# Patient Record
Sex: Female | Born: 1995
Health system: Southern US, Community
[De-identification: ages and names within clinical notes are randomized; demographics above are authoritative.]

## PROBLEM LIST (undated history)

## (undated) DIAGNOSIS — J45990 Exercise induced bronchospasm: Secondary | ICD-10-CM

## (undated) DIAGNOSIS — H7290 Unspecified perforation of tympanic membrane, unspecified ear: Secondary | ICD-10-CM

## (undated) DIAGNOSIS — Z87820 Personal history of traumatic brain injury: Secondary | ICD-10-CM

## (undated) DIAGNOSIS — J302 Other seasonal allergic rhinitis: Secondary | ICD-10-CM

## (undated) DIAGNOSIS — Z8489 Family history of other specified conditions: Secondary | ICD-10-CM

## (undated) HISTORY — PX: WISDOM TOOTH EXTRACTION: SHX21

---
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
## 2016-06-09 ENCOUNTER — Ambulatory Visit (INDEPENDENT_AMBULATORY_CARE_PROVIDER_SITE_OTHER): Payer: BLUE CROSS/BLUE SHIELD | Admitting: Otolaryngology

## 2016-06-09 DIAGNOSIS — H6983 Other specified disorders of Eustachian tube, bilateral: Secondary | ICD-10-CM

## 2016-06-09 DIAGNOSIS — H7201 Central perforation of tympanic membrane, right ear: Secondary | ICD-10-CM

## 2016-06-09 DIAGNOSIS — J343 Hypertrophy of nasal turbinates: Secondary | ICD-10-CM

## 2016-06-09 DIAGNOSIS — J310 Chronic rhinitis: Secondary | ICD-10-CM

## 2016-06-10 ENCOUNTER — Other Ambulatory Visit (INDEPENDENT_AMBULATORY_CARE_PROVIDER_SITE_OTHER): Payer: Self-pay | Admitting: Otolaryngology

## 2016-06-10 DIAGNOSIS — J329 Chronic sinusitis, unspecified: Secondary | ICD-10-CM

## 2016-06-16 ENCOUNTER — Ambulatory Visit
Admission: RE | Admit: 2016-06-16 | Discharge: 2016-06-16 | Disposition: A | Discharge status: Home / Self Care | Payer: BLUE CROSS/BLUE SHIELD | Source: Ambulatory Visit | Attending: Otolaryngology | Admitting: Otolaryngology

## 2016-06-16 DIAGNOSIS — J329 Chronic sinusitis, unspecified: Secondary | ICD-10-CM

## 2016-06-29 DIAGNOSIS — J343 Hypertrophy of nasal turbinates: Secondary | ICD-10-CM | POA: Diagnosis not present

## 2016-06-29 DIAGNOSIS — J310 Chronic rhinitis: Secondary | ICD-10-CM | POA: Diagnosis not present

## 2016-06-29 DIAGNOSIS — J342 Deviated nasal septum: Secondary | ICD-10-CM | POA: Diagnosis not present

## 2016-07-05 DIAGNOSIS — J3089 Other allergic rhinitis: Secondary | ICD-10-CM | POA: Diagnosis not present

## 2016-07-05 DIAGNOSIS — J45990 Exercise induced bronchospasm: Secondary | ICD-10-CM | POA: Diagnosis not present

## 2016-07-05 DIAGNOSIS — J3081 Allergic rhinitis due to animal (cat) (dog) hair and dander: Secondary | ICD-10-CM | POA: Diagnosis not present

## 2017-01-23 DIAGNOSIS — L700 Acne vulgaris: Secondary | ICD-10-CM | POA: Diagnosis not present

## 2017-01-26 DIAGNOSIS — R197 Diarrhea, unspecified: Secondary | ICD-10-CM | POA: Diagnosis not present

## 2017-01-26 DIAGNOSIS — R5383 Other fatigue: Secondary | ICD-10-CM | POA: Diagnosis not present

## 2017-01-26 DIAGNOSIS — R51 Headache: Secondary | ICD-10-CM | POA: Diagnosis not present

## 2017-02-06 DIAGNOSIS — D72829 Elevated white blood cell count, unspecified: Secondary | ICD-10-CM | POA: Diagnosis not present

## 2017-02-10 DIAGNOSIS — R197 Diarrhea, unspecified: Secondary | ICD-10-CM | POA: Diagnosis not present

## 2017-02-10 DIAGNOSIS — G43109 Migraine with aura, not intractable, without status migrainosus: Secondary | ICD-10-CM | POA: Diagnosis not present

## 2017-02-15 DIAGNOSIS — M25561 Pain in right knee: Secondary | ICD-10-CM | POA: Diagnosis not present

## 2017-02-15 DIAGNOSIS — M2391 Unspecified internal derangement of right knee: Secondary | ICD-10-CM | POA: Diagnosis not present

## 2017-02-24 DIAGNOSIS — M1711 Unilateral primary osteoarthritis, right knee: Secondary | ICD-10-CM | POA: Diagnosis not present

## 2017-02-24 DIAGNOSIS — M25461 Effusion, right knee: Secondary | ICD-10-CM | POA: Diagnosis not present

## 2017-03-01 DIAGNOSIS — S83411A Sprain of medial collateral ligament of right knee, initial encounter: Secondary | ICD-10-CM | POA: Diagnosis not present

## 2017-03-17 DIAGNOSIS — M25561 Pain in right knee: Secondary | ICD-10-CM | POA: Diagnosis not present

## 2017-03-17 DIAGNOSIS — M25661 Stiffness of right knee, not elsewhere classified: Secondary | ICD-10-CM | POA: Diagnosis not present

## 2017-03-22 DIAGNOSIS — H1031 Unspecified acute conjunctivitis, right eye: Secondary | ICD-10-CM | POA: Diagnosis not present

## 2017-04-10 DIAGNOSIS — J029 Acute pharyngitis, unspecified: Secondary | ICD-10-CM | POA: Diagnosis not present

## 2017-04-10 DIAGNOSIS — J0100 Acute maxillary sinusitis, unspecified: Secondary | ICD-10-CM | POA: Diagnosis not present

## 2017-04-10 DIAGNOSIS — R509 Fever, unspecified: Secondary | ICD-10-CM | POA: Diagnosis not present

## 2017-06-15 DIAGNOSIS — L709 Acne, unspecified: Secondary | ICD-10-CM | POA: Diagnosis not present

## 2017-06-15 DIAGNOSIS — Z01419 Encounter for gynecological examination (general) (routine) without abnormal findings: Secondary | ICD-10-CM | POA: Diagnosis not present

## 2017-06-15 DIAGNOSIS — N946 Dysmenorrhea, unspecified: Secondary | ICD-10-CM | POA: Diagnosis not present

## 2017-06-16 DIAGNOSIS — J45990 Exercise induced bronchospasm: Secondary | ICD-10-CM | POA: Diagnosis not present

## 2017-06-16 DIAGNOSIS — J3081 Allergic rhinitis due to animal (cat) (dog) hair and dander: Secondary | ICD-10-CM | POA: Diagnosis not present

## 2017-06-16 DIAGNOSIS — J3089 Other allergic rhinitis: Secondary | ICD-10-CM | POA: Diagnosis not present

## 2017-06-16 DIAGNOSIS — L989 Disorder of the skin and subcutaneous tissue, unspecified: Secondary | ICD-10-CM | POA: Diagnosis not present

## 2017-07-09 DIAGNOSIS — R197 Diarrhea, unspecified: Secondary | ICD-10-CM | POA: Diagnosis not present

## 2017-07-09 DIAGNOSIS — R112 Nausea with vomiting, unspecified: Secondary | ICD-10-CM | POA: Diagnosis not present

## 2017-07-09 DIAGNOSIS — K529 Noninfective gastroenteritis and colitis, unspecified: Secondary | ICD-10-CM | POA: Diagnosis not present

## 2017-07-20 IMAGING — CT CT MAXILLOFACIAL W/O CM
3 series · 18 of 37 positions shown, 20 images · non-contrast
Comparison: None.

CLINICAL DATA: Chronic sinus symptoms.  Postnasal drainage.

EXAM:
CT MAXILLOFACIAL WITHOUT CONTRAST
TECHNIQUE: Multidetector CT imaging of the maxillofacial structures was
performed. Multiplanar CT image reconstructions were also generated.
A small metallic BB was placed on the right temple in order to
reliably differentiate right from left.

[Series 3: axial soft 1.25 · axial · 0.49mm/px · z∈[-33,+100]mm · 7 of 158 slices shown]
[im 17/158  brain]
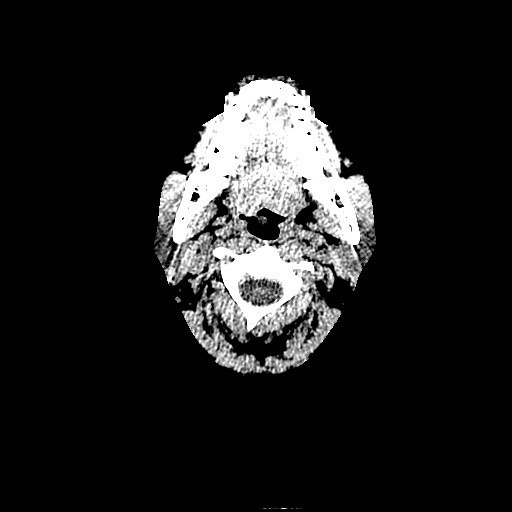
[im 34/158  brain]
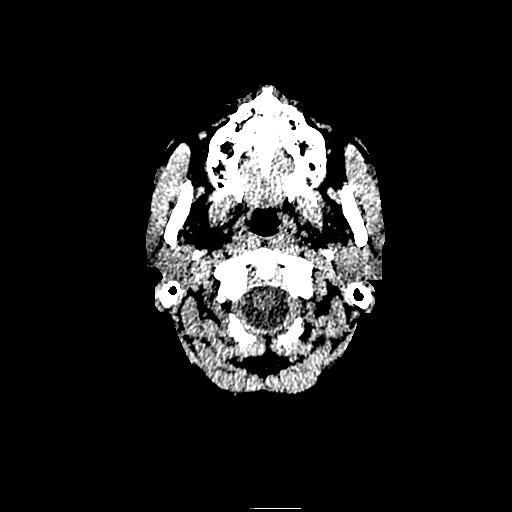
[im 50/158  brain]
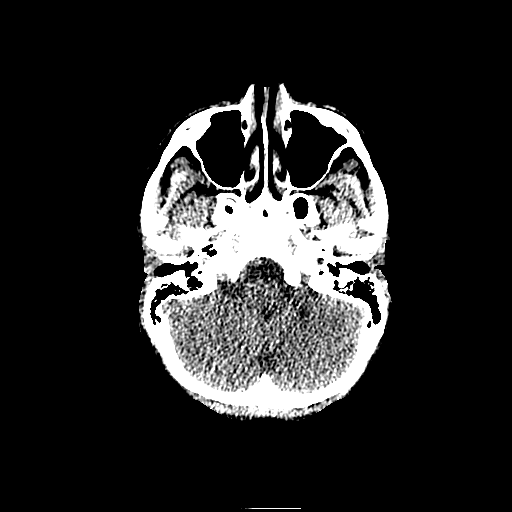
[im 67/158  brain]
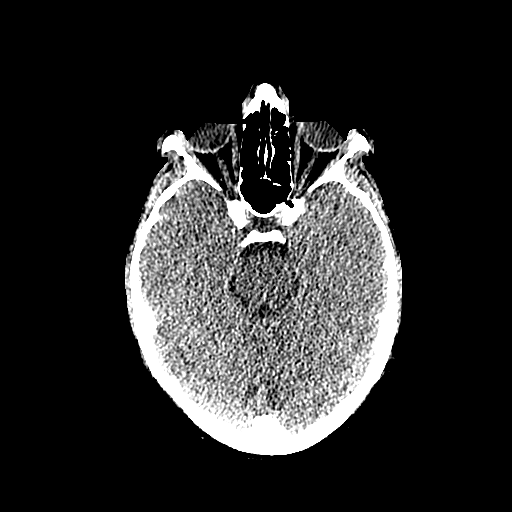
[im 91/158  brain]
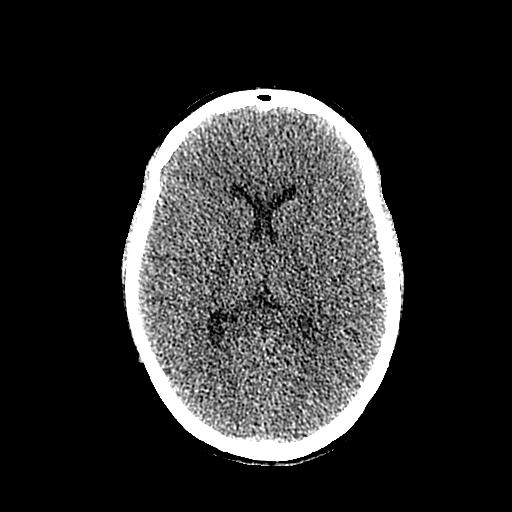
[im 108/158  brain]
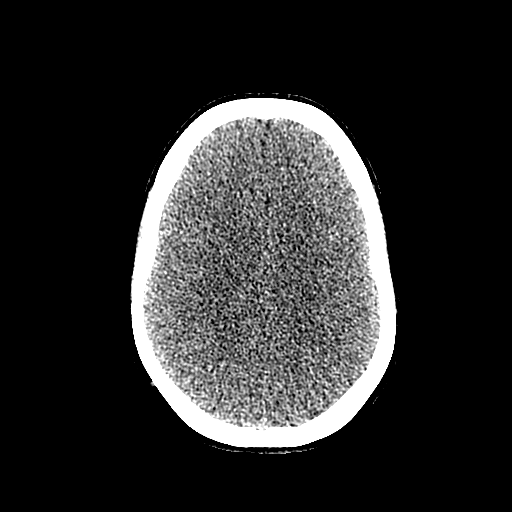
[im 124/158  brain]
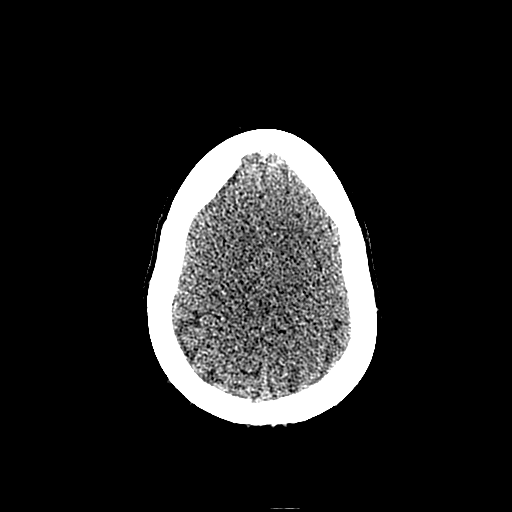

[Series 601: coronal facial · coronal · 0.49mm/px · 3 of 111 slices shown]
[im 37/111  bone]
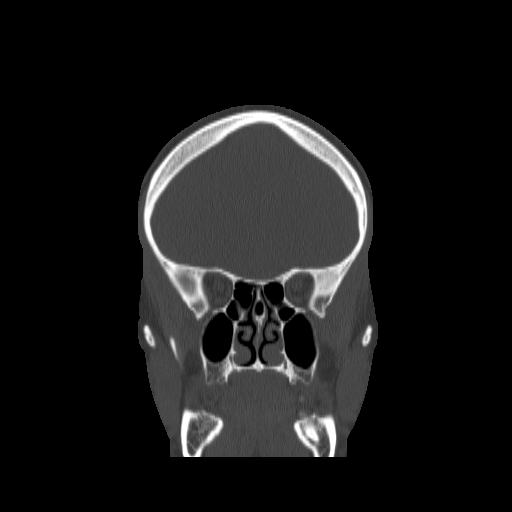
[im 56/111  bone]
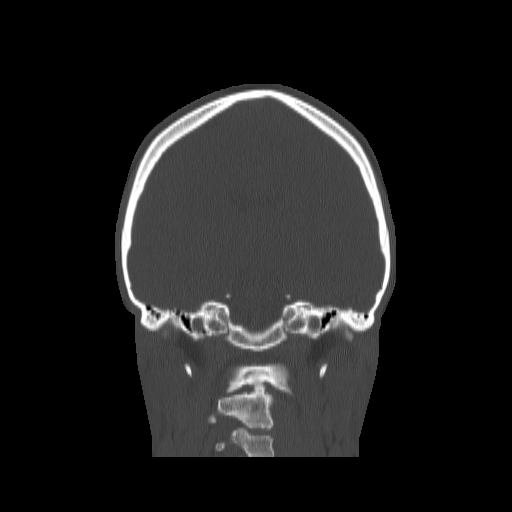
[im 74/111  bone]
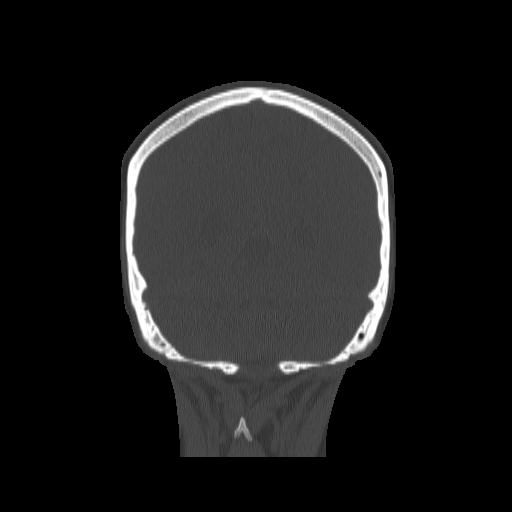

[Series 602: sagittal facial · sagittal · 0.49mm/px · 8 of 78 slices shown, 10 images]
[im 9/78  brain]
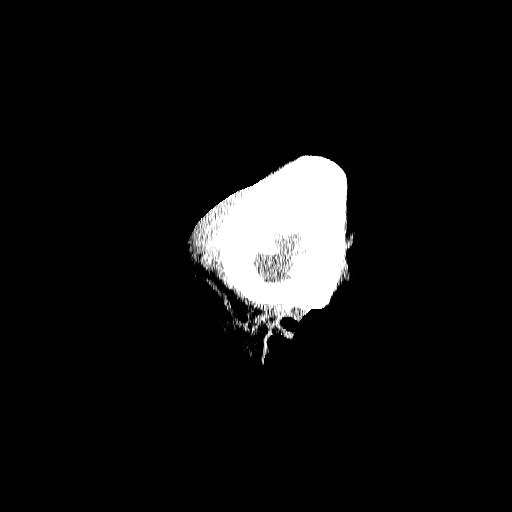
[im 9/78  bone]
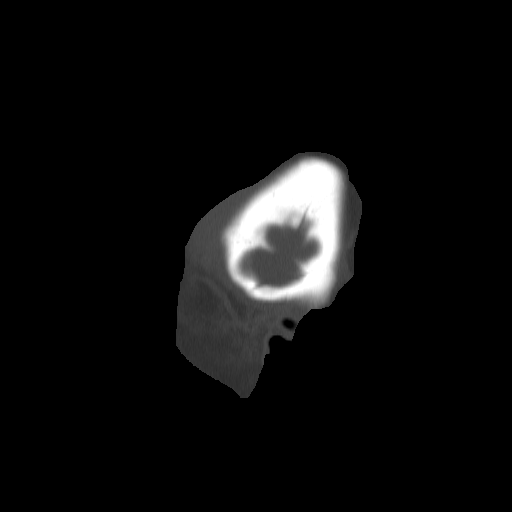
[im 18/78  bone]
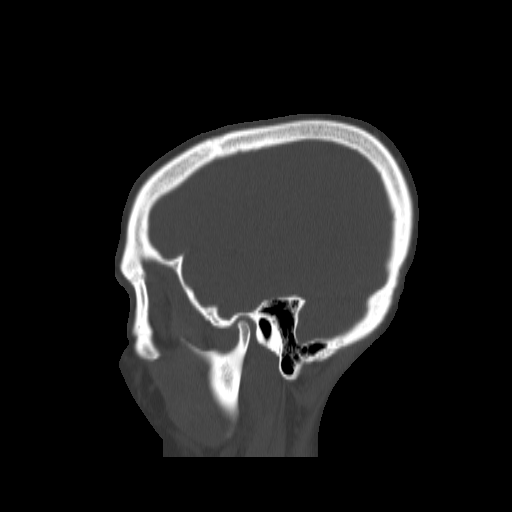
[im 26/78  bone]
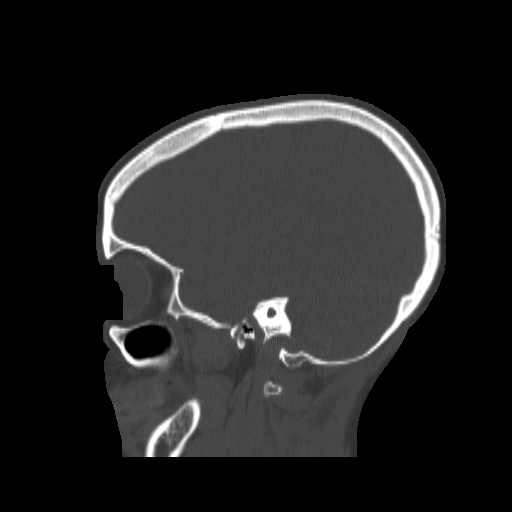
[im 35/78  bone]
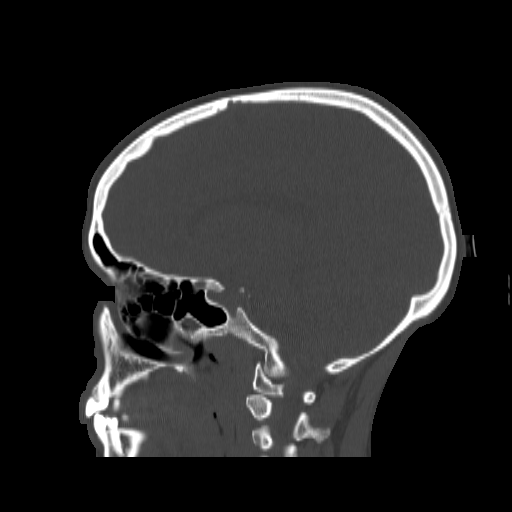
[im 43/78  brain]
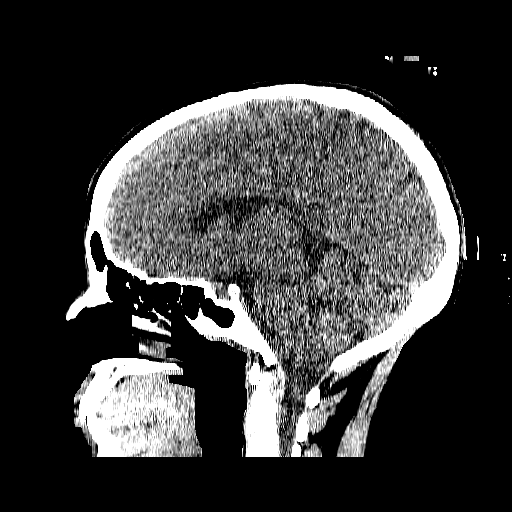
[im 43/78  bone]
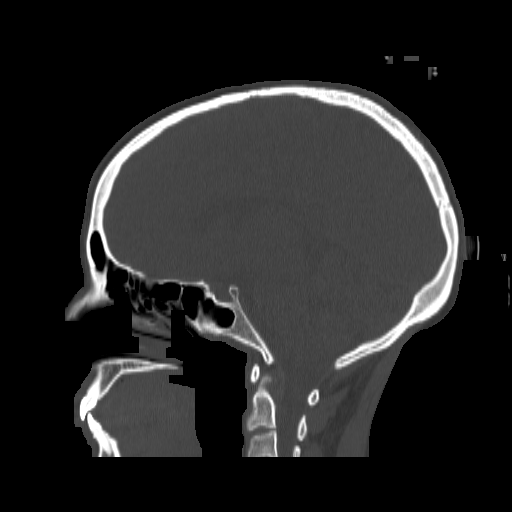
[im 52/78  bone]
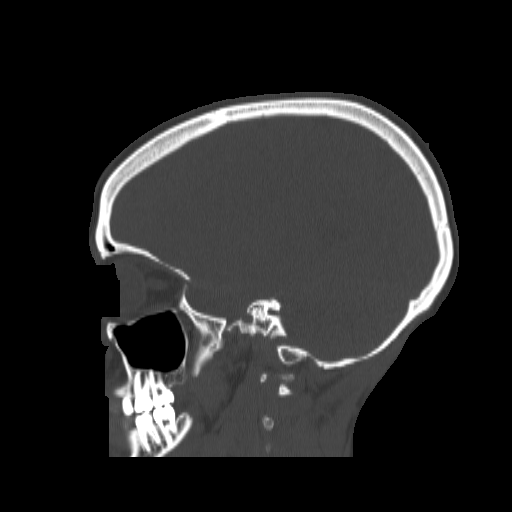
[im 60/78  bone]
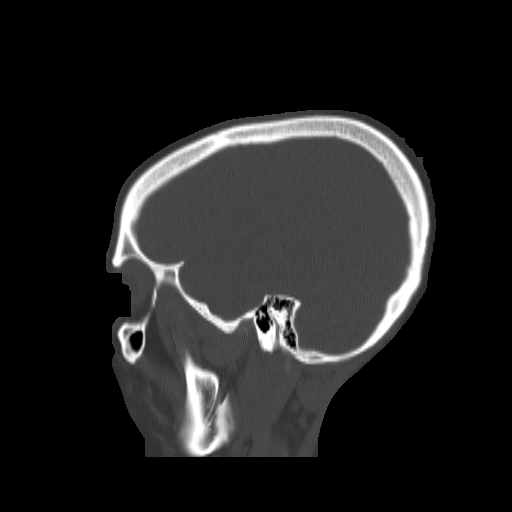
[im 69/78  bone]
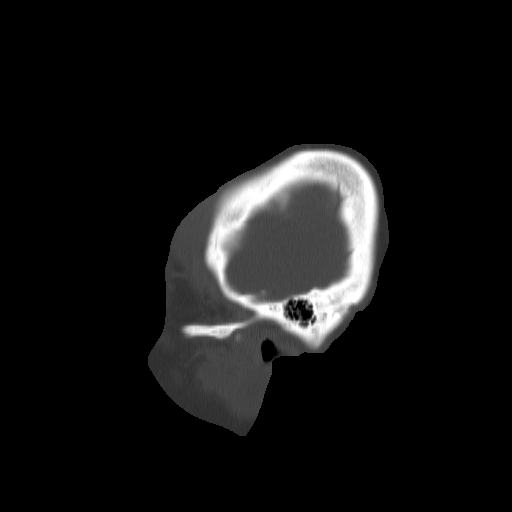

[18 of 37 positions shown; findings below may reference images not displayed]

FINDINGS: Osseous: No fracture or mandibular dislocation. No destructive
process.

Orbits: Negative. No traumatic or inflammatory finding.

Sinuses: Clear. Nasal septum deviates mildly LEFT-to-RIGHT. No
turbinate hypertrophy or concha bullosa.

Soft tissues: Negative.

Limited intracranial: None.
IMPRESSION: Negative exam.

## 2017-10-13 DIAGNOSIS — H1033 Unspecified acute conjunctivitis, bilateral: Secondary | ICD-10-CM | POA: Diagnosis not present

## 2017-10-26 DIAGNOSIS — Z7189 Other specified counseling: Secondary | ICD-10-CM | POA: Diagnosis not present

## 2017-10-26 DIAGNOSIS — Z23 Encounter for immunization: Secondary | ICD-10-CM | POA: Diagnosis not present

## 2017-12-27 DIAGNOSIS — R197 Diarrhea, unspecified: Secondary | ICD-10-CM | POA: Diagnosis not present

## 2018-01-17 DIAGNOSIS — J0100 Acute maxillary sinusitis, unspecified: Secondary | ICD-10-CM | POA: Diagnosis not present

## 2018-01-17 DIAGNOSIS — R05 Cough: Secondary | ICD-10-CM | POA: Diagnosis not present

## 2018-02-12 DIAGNOSIS — N809 Endometriosis, unspecified: Secondary | ICD-10-CM | POA: Diagnosis not present

## 2018-02-12 DIAGNOSIS — R102 Pelvic and perineal pain: Secondary | ICD-10-CM | POA: Diagnosis not present

## 2018-02-14 DIAGNOSIS — N809 Endometriosis, unspecified: Secondary | ICD-10-CM | POA: Diagnosis not present

## 2018-02-14 DIAGNOSIS — R102 Pelvic and perineal pain: Secondary | ICD-10-CM | POA: Diagnosis not present

## 2018-06-11 DIAGNOSIS — J45990 Exercise induced bronchospasm: Secondary | ICD-10-CM | POA: Diagnosis not present

## 2018-06-11 DIAGNOSIS — J3089 Other allergic rhinitis: Secondary | ICD-10-CM | POA: Diagnosis not present

## 2018-06-11 DIAGNOSIS — J3081 Allergic rhinitis due to animal (cat) (dog) hair and dander: Secondary | ICD-10-CM | POA: Diagnosis not present

## 2018-06-21 DIAGNOSIS — R102 Pelvic and perineal pain: Secondary | ICD-10-CM | POA: Diagnosis not present

## 2018-06-21 DIAGNOSIS — Z6824 Body mass index (BMI) 24.0-24.9, adult: Secondary | ICD-10-CM | POA: Diagnosis not present

## 2018-06-21 DIAGNOSIS — N939 Abnormal uterine and vaginal bleeding, unspecified: Secondary | ICD-10-CM | POA: Diagnosis not present

## 2018-06-21 DIAGNOSIS — Z124 Encounter for screening for malignant neoplasm of cervix: Secondary | ICD-10-CM | POA: Diagnosis not present

## 2018-06-21 DIAGNOSIS — Z01419 Encounter for gynecological examination (general) (routine) without abnormal findings: Secondary | ICD-10-CM | POA: Diagnosis not present

## 2019-05-08 ENCOUNTER — Other Ambulatory Visit: Payer: Self-pay

## 2019-05-08 DIAGNOSIS — Z20822 Contact with and (suspected) exposure to covid-19: Secondary | ICD-10-CM

## 2019-05-09 LAB — NOVEL CORONAVIRUS, NAA: SARS-CoV-2, NAA: NOT DETECTED

## 2019-05-14 ENCOUNTER — Ambulatory Visit (INDEPENDENT_AMBULATORY_CARE_PROVIDER_SITE_OTHER): Payer: Commercial Managed Care - PPO | Admitting: Sports Medicine

## 2019-05-14 ENCOUNTER — Other Ambulatory Visit: Payer: Self-pay

## 2019-05-14 VITALS — BP 102/68 | Ht 63.0 in | Wt 133.0 lb

## 2019-05-14 DIAGNOSIS — M7740 Metatarsalgia, unspecified foot: Secondary | ICD-10-CM

## 2019-05-15 ENCOUNTER — Encounter: Payer: Self-pay | Admitting: Sports Medicine

## 2019-05-15 NOTE — Progress Notes (Signed)
   Subjective:    Patient ID: Melissa Richmond, female    DOB: 1996-05-29, 23 y.o.   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 French Ana for possible custom orthotics.  She recently trained for and completed a marathon.  During her training she began to experience pain in the right foot that she localizes to the plantar aspect of the foot near the second metatarsal head.  Pain began about 6 weeks prior to her race.  She was able to run the race and complete it.  She then saw Dr. French Ana on November 16 and had x-rays of her foot done which are available for my review.  He diagnosed her with possible metatarsalgia and recommended that she try some orthotics.  She denies pain on the dorsum of the foot.  She has not noticed any swelling.  She denies pain elsewhere in the foot.  Her pain occurs at a very specific point, at about 3 to 4 miles into her run.  She is able to walk with minimal discomfort.  She does get some numbness and tingling diffusely through her toes.  No prior foot surgeries.  She denies pain back at the ankle.  Past medical history reviewed Medications reviewed Allergies reviewed   Review of Systems    As above Objective:   Physical Exam  Well-developed, fit appearing.  No acute distress.  Awake alert and oriented x3.  Vital signs reviewed  Examination of her feet in the standing position shows fairly well preserved longitudinal and transverse arch.  She does have callus buildup along the plantar aspect of the foot at the metatarsal heads.  She is mildly tender to palpation at the second metatarsal head. Negative Mulder's sign.  No tenderness to palpation across the dorsum of the foot.  No soft tissue swelling.  Good pulses.  Examination of her running gait shows her to be a heel striker landing and pronation.  No limp.  X-rays of her right foot including AP, lateral, and oblique views are  unremarkable.  These are outside images from [place]:   Right foot pain likely secondary to metatarsalgia Pronation  Custom orthotics were created for the patient today.  We put a metatarsal pad on the right orthotic.  She found these to be comfortable prior to leaving the office.  I did advise her to take a couple of weeks off from running and instead cross train on a bike.  She may then return to running at 50% of her regular volume with instructions not to run 2 days in a row at first.  She may then start to increase her volume by 10 %/week.  If she continues to experience foot pain despite today's custom orthotics and relative rest then she will return to Dr. French Ana for a possible MRI.  Follow-up with me as needed.  Patient was fitted for a : standard, cushioned, semi-rigid orthotic. The orthotic was heated and afterward the patient stood on the orthotic blank positioned on the orthotic stand. The patient was positioned in subtalar neutral position and 10 degrees of ankle dorsiflexion in a weight bearing stance. After completion of molding, a stable base was applied to the orthotic blank. The blank was ground to a stable position for weight bearing. Size: 6 Base: Blue EVA Posting: none Additional orthotic padding: metatarsal pad, right

## 2019-05-16 ENCOUNTER — Ambulatory Visit: Payer: BLUE CROSS/BLUE SHIELD | Admitting: Sports Medicine

## 2019-05-29 ENCOUNTER — Other Ambulatory Visit: Payer: Self-pay

## 2019-05-29 DIAGNOSIS — Z20822 Contact with and (suspected) exposure to covid-19: Secondary | ICD-10-CM

## 2019-05-31 LAB — NOVEL CORONAVIRUS, NAA: SARS-CoV-2, NAA: NOT DETECTED

## 2019-06-04 ENCOUNTER — Other Ambulatory Visit: Payer: Self-pay

## 2019-06-04 DIAGNOSIS — Z20822 Contact with and (suspected) exposure to covid-19: Secondary | ICD-10-CM

## 2019-06-05 LAB — NOVEL CORONAVIRUS, NAA: SARS-CoV-2, NAA: NOT DETECTED

## 2019-06-07 ENCOUNTER — Other Ambulatory Visit: Payer: Self-pay

## 2019-06-07 DIAGNOSIS — Z20822 Contact with and (suspected) exposure to covid-19: Secondary | ICD-10-CM

## 2019-06-08 LAB — NOVEL CORONAVIRUS, NAA: SARS-CoV-2, NAA: NOT DETECTED

## 2019-10-08 ENCOUNTER — Other Ambulatory Visit: Payer: Self-pay | Admitting: Physician Assistant

## 2019-10-08 DIAGNOSIS — R1030 Lower abdominal pain, unspecified: Secondary | ICD-10-CM

## 2019-10-10 ENCOUNTER — Other Ambulatory Visit: Payer: Self-pay | Admitting: Physician Assistant

## 2019-10-10 DIAGNOSIS — R1030 Lower abdominal pain, unspecified: Secondary | ICD-10-CM

## 2019-10-24 ENCOUNTER — Ambulatory Visit
Admission: RE | Admit: 2019-10-24 | Discharge: 2019-10-24 | Disposition: A | Discharge status: Home / Self Care | Payer: Commercial Managed Care - PPO | Source: Ambulatory Visit | Attending: Physician Assistant | Admitting: Physician Assistant

## 2019-10-24 DIAGNOSIS — R1030 Lower abdominal pain, unspecified: Secondary | ICD-10-CM

## 2019-10-24 MED ORDER — IOPAMIDOL (ISOVUE-300) INJECTION 61%
100.0000 mL | Freq: Once | INTRAVENOUS | Status: AC | PRN
  Administered 2019-10-24: 100 mL via INTRAVENOUS

## 2020-11-26 IMAGING — CT CT ABD-PELV W/ CM
1 of 2 series · 14 of 32 positions shown, 19 images · IV contrast (APPLIED)
Comparison: 02/17/2012

CLINICAL DATA: Lower abdominal pain, cramping, and bloating for 4
months.

EXAM:
CT ABDOMEN AND PELVIS WITH CONTRAST
TECHNIQUE: Multidetector CT imaging of the abdomen and pelvis was performed
using the standard protocol following bolus administration of
intravenous contrast.
CONTRAST:  100mL [id number] IOPAMIDOL ([id number]) INJECTION 61%

[Series 2: abd/pelvis w/cm · axial · 0.67mm/px · z∈[-501,-86]mm · 14 of 93 slices shown, 19 images]
[im 5/93  soft-tissue]
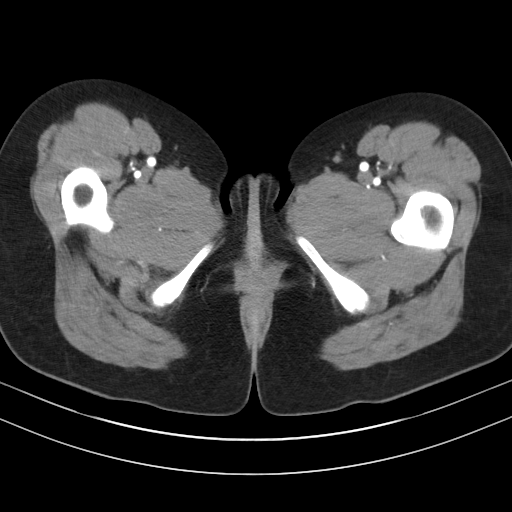
[im 5/93  bone]
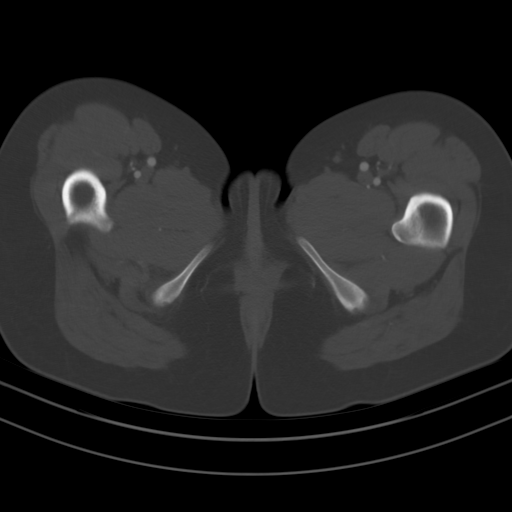
[im 14/93  soft-tissue]
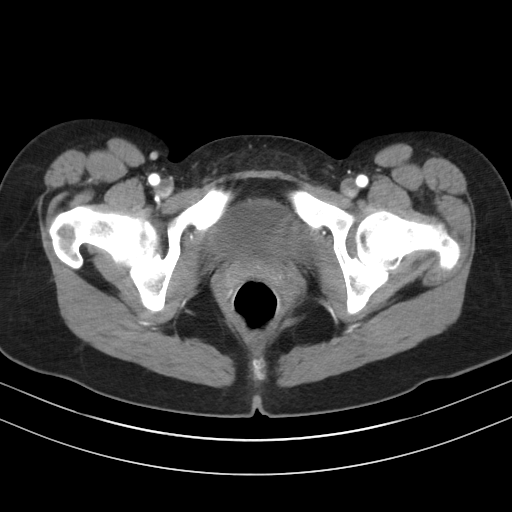
[im 19/93  soft-tissue]
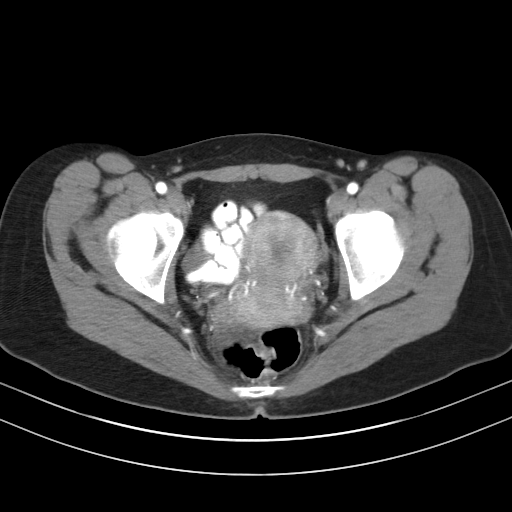
[im 28/93  soft-tissue]
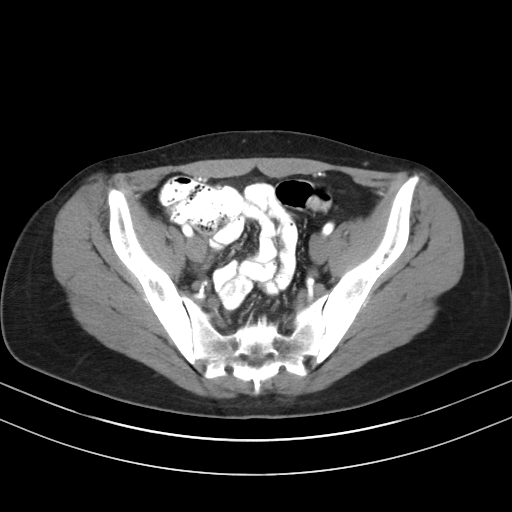
[im 33/93  soft-tissue]
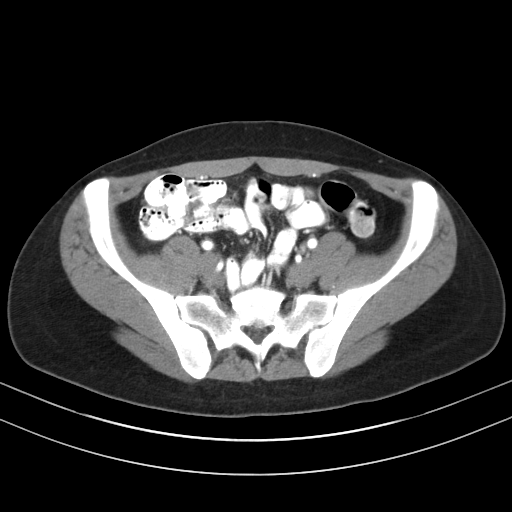
[im 42/93  soft-tissue]
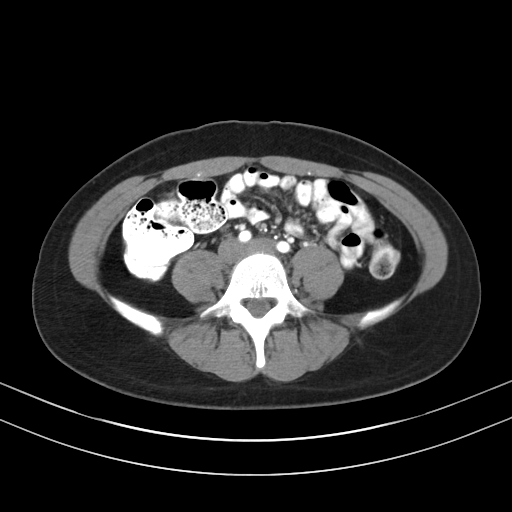
[im 47/93  soft-tissue]
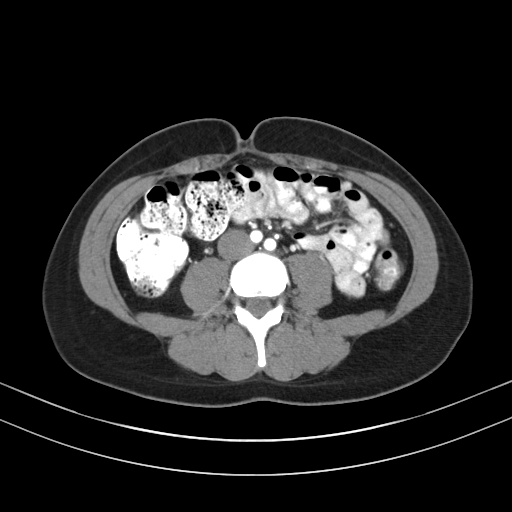
[im 51/93  soft-tissue]
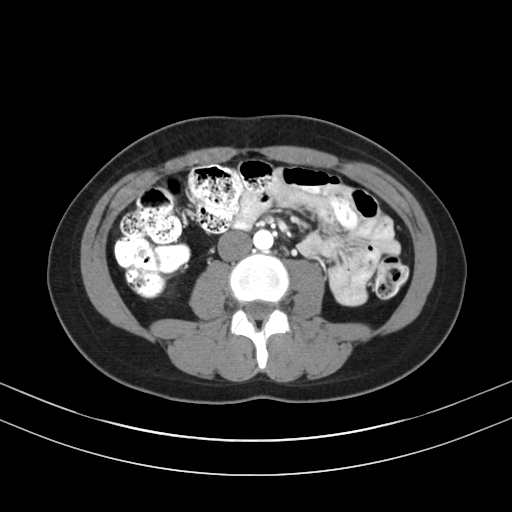
[im 60/93  soft-tissue]
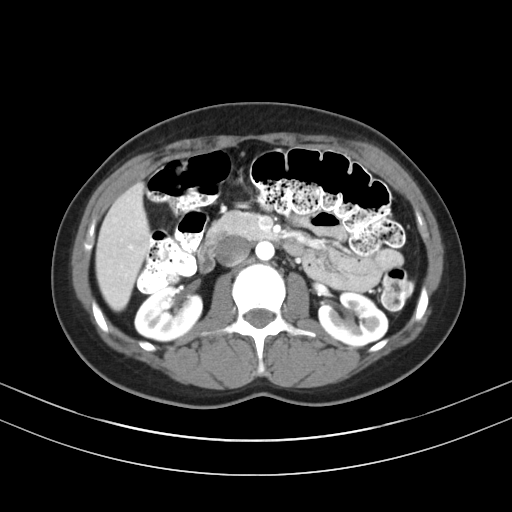
[im 60/93  bone]
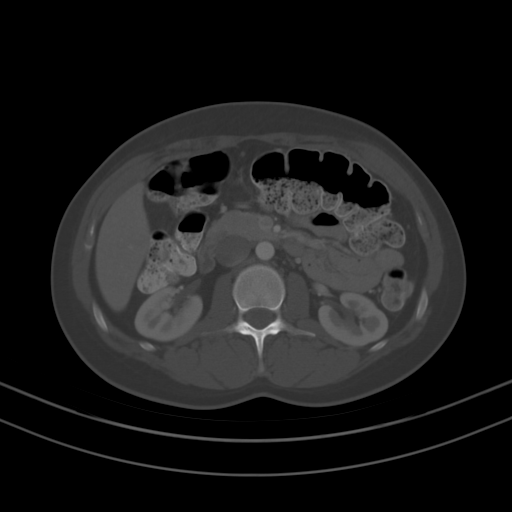
[im 65/93  soft-tissue]
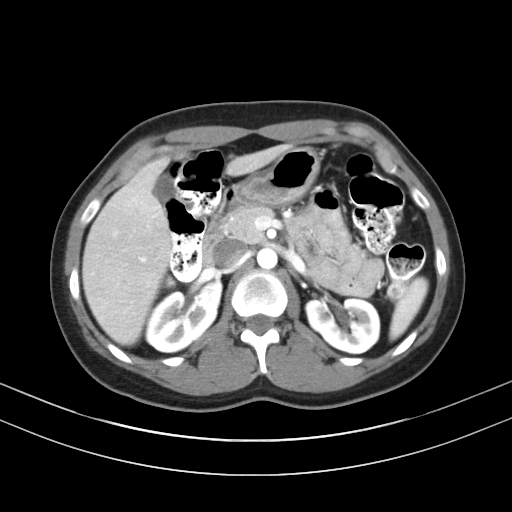
[im 74/93  soft-tissue]
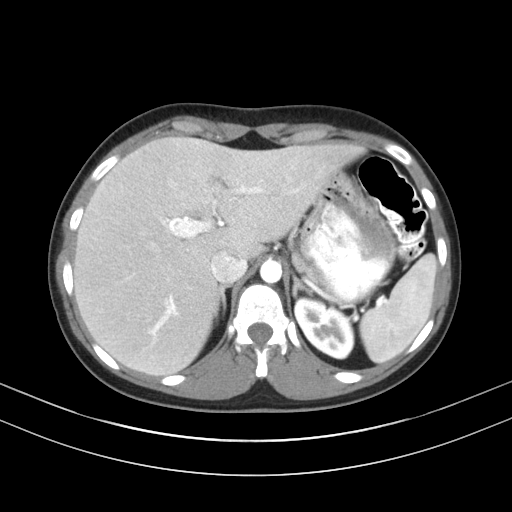
[im 74/93  lung]
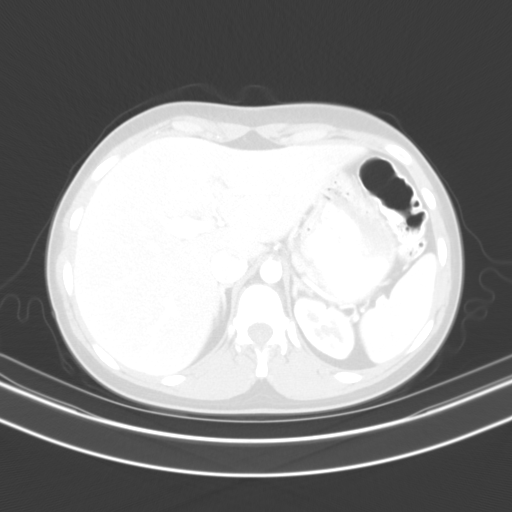
[im 79/93  soft-tissue]
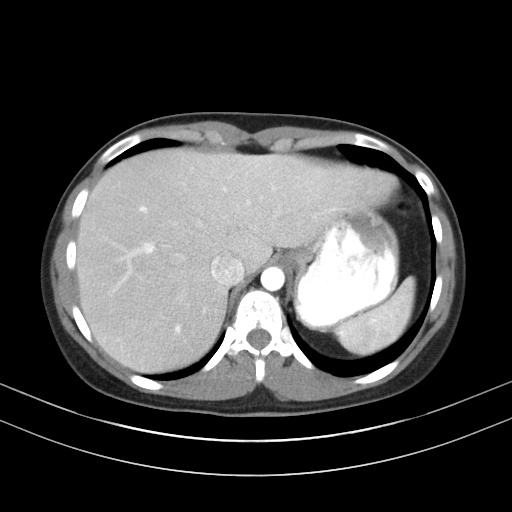
[im 79/93  lung]
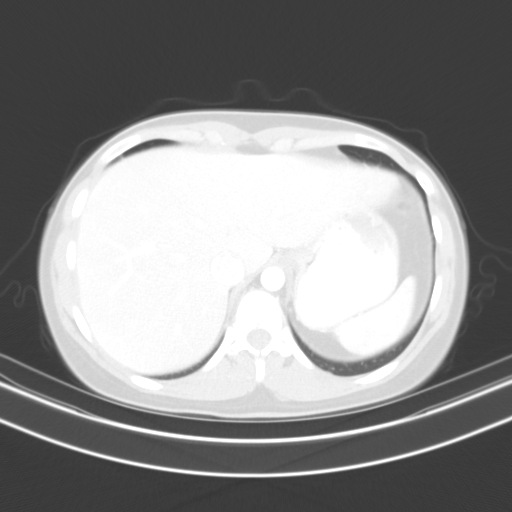
[im 83/93  lung]
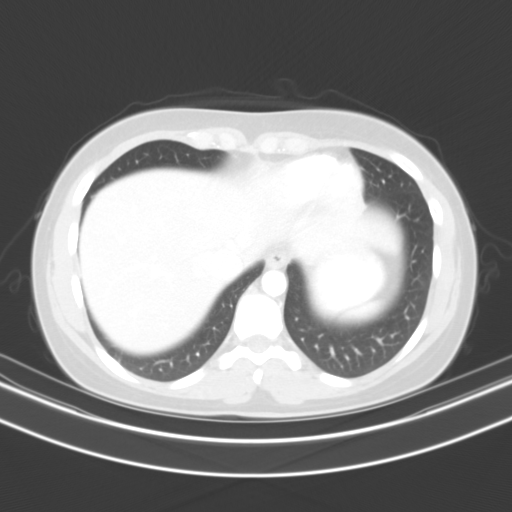
[im 88/93  soft-tissue]
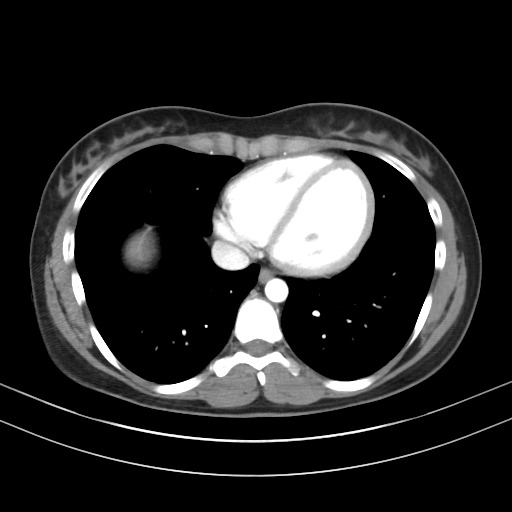
[im 88/93  lung]
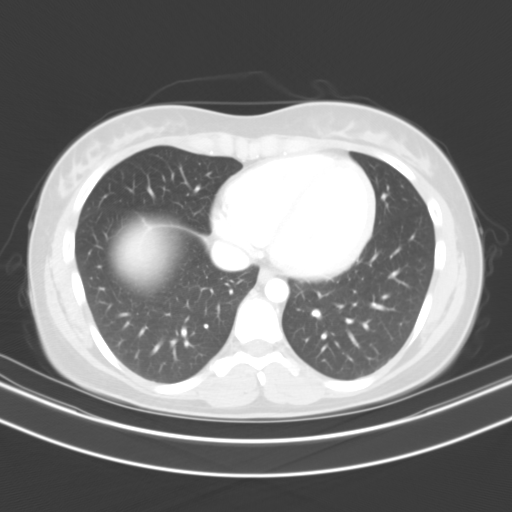

[14 of 32 positions shown; findings below may reference images not displayed]

FINDINGS: Lower Chest: No acute findings.

Hepatobiliary: No hepatic masses identified. Gallbladder is
unremarkable. No evidence of biliary ductal dilatation.

Pancreas:  No mass or inflammatory changes.

Spleen: Within normal limits in size and appearance.

Adrenals/Urinary Tract: No masses identified. No evidence of
ureteral calculi or hydronephrosis.

Stomach/Bowel: No evidence of obstruction, inflammatory process or
abnormal fluid collections. Normal appendix visualized.

Vascular/Lymphatic: No pathologically enlarged lymph nodes. No
abdominal aortic aneurysm.

Reproductive:  No mass or other significant abnormality.

Other:  None.

Musculoskeletal:  No suspicious bone lesions identified.
IMPRESSION: Negative. No acute findings or other significant abnormality.
